# Patient Record
Sex: Male | Born: 1965 | Race: Black or African American | Hispanic: No | Marital: Single
Health system: Southern US, Community
[De-identification: ages and names within clinical notes are randomized; demographics above are authoritative.]

---

## 2005-10-13 ENCOUNTER — Observation Stay: Payer: Self-pay | Admitting: General Surgery

## 2005-10-20 ENCOUNTER — Encounter: Payer: Self-pay | Admitting: General Surgery

## 2005-11-03 ENCOUNTER — Ambulatory Visit: Payer: Self-pay | Admitting: General Surgery

## 2005-11-17 ENCOUNTER — Encounter: Payer: Self-pay | Admitting: General Surgery

## 2008-09-24 ENCOUNTER — Emergency Department: Payer: Self-pay | Admitting: Emergency Medicine

## 2009-09-15 ENCOUNTER — Ambulatory Visit: Payer: Self-pay

## 2014-03-01 ENCOUNTER — Emergency Department: Payer: Self-pay | Admitting: Emergency Medicine

## 2014-08-09 ENCOUNTER — Ambulatory Visit: Payer: Self-pay | Admitting: Physician Assistant

## 2014-08-10 ENCOUNTER — Ambulatory Visit: Payer: Self-pay | Admitting: Physician Assistant

## 2014-08-12 ENCOUNTER — Ambulatory Visit: Payer: Self-pay | Admitting: Emergency Medicine

## 2014-08-13 LAB — WOUND CULTURE
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
## 2020-05-22 ENCOUNTER — Telehealth: Payer: Self-pay

## 2020-05-22 NOTE — Telephone Encounter (Signed)
Patient called and asked about opiod in urine test, asked what does that mean. I advised it means he has taken an opiod classification drug. He says he is not on any medication and has not taken any drugs. He says he only drinks beer and whiskey. He says he had a drug test for work and that's what he was told. I asked does he have a PCP, he says yes. I advised for him to call his PCP for additional clarifications, he verbalized understanding.

## 2023-01-18 DIAGNOSIS — H6122 Impacted cerumen, left ear: Secondary | ICD-10-CM | POA: Diagnosis not present

## 2023-01-18 DIAGNOSIS — F17210 Nicotine dependence, cigarettes, uncomplicated: Secondary | ICD-10-CM | POA: Diagnosis not present

## 2023-01-18 DIAGNOSIS — R059 Cough, unspecified: Secondary | ICD-10-CM | POA: Diagnosis not present

## 2023-01-18 DIAGNOSIS — R0902 Hypoxemia: Secondary | ICD-10-CM | POA: Diagnosis not present

## 2023-01-18 DIAGNOSIS — H9012 Conductive hearing loss, unilateral, left ear, with unrestricted hearing on the contralateral side: Secondary | ICD-10-CM | POA: Diagnosis not present

## 2023-01-18 DIAGNOSIS — J9601 Acute respiratory failure with hypoxia: Secondary | ICD-10-CM | POA: Diagnosis not present

## 2023-01-18 DIAGNOSIS — Z20822 Contact with and (suspected) exposure to covid-19: Secondary | ICD-10-CM | POA: Diagnosis not present

## 2023-01-19 DIAGNOSIS — J9601 Acute respiratory failure with hypoxia: Secondary | ICD-10-CM | POA: Diagnosis not present

## 2023-01-20 DIAGNOSIS — J9601 Acute respiratory failure with hypoxia: Secondary | ICD-10-CM | POA: Diagnosis not present

## 2023-02-06 DIAGNOSIS — W010XXA Fall on same level from slipping, tripping and stumbling without subsequent striking against object, initial encounter: Secondary | ICD-10-CM | POA: Diagnosis not present

## 2023-02-06 DIAGNOSIS — I10 Essential (primary) hypertension: Secondary | ICD-10-CM | POA: Diagnosis not present

## 2023-02-06 DIAGNOSIS — S91212A Laceration without foreign body of left great toe with damage to nail, initial encounter: Secondary | ICD-10-CM | POA: Diagnosis not present

## 2023-02-06 DIAGNOSIS — F17210 Nicotine dependence, cigarettes, uncomplicated: Secondary | ICD-10-CM | POA: Diagnosis not present

## 2023-02-06 DIAGNOSIS — S91201A Unspecified open wound of right great toe with damage to nail, initial encounter: Secondary | ICD-10-CM | POA: Diagnosis not present

## 2023-02-06 DIAGNOSIS — J439 Emphysema, unspecified: Secondary | ICD-10-CM | POA: Diagnosis not present

## 2023-02-06 DIAGNOSIS — M19071 Primary osteoarthritis, right ankle and foot: Secondary | ICD-10-CM | POA: Diagnosis not present

## 2023-02-06 DIAGNOSIS — S9031XA Contusion of right foot, initial encounter: Secondary | ICD-10-CM | POA: Diagnosis not present

## 2023-02-06 DIAGNOSIS — S99921A Unspecified injury of right foot, initial encounter: Secondary | ICD-10-CM | POA: Diagnosis not present

## 2023-02-06 DIAGNOSIS — M79672 Pain in left foot: Secondary | ICD-10-CM | POA: Diagnosis not present

## 2023-02-15 ENCOUNTER — Ambulatory Visit: Payer: Self-pay | Admitting: Physician Assistant
